# Patient Record
Sex: Female | Born: 1980 | Race: Black or African American | Hispanic: No | Marital: Single | Smoking: Never smoker
Health system: Southern US, Community
[De-identification: ages and names within clinical notes are randomized; demographics above are authoritative.]

---

## 2017-03-11 ENCOUNTER — Encounter: Payer: Self-pay | Admitting: *Deleted

## 2017-03-11 ENCOUNTER — Ambulatory Visit: Admission: EM | Admit: 2017-03-11 | Discharge: 2017-03-11 | Discharge status: Against Medical Advice | Payer: 59

## 2017-03-11 ENCOUNTER — Other Ambulatory Visit: Payer: Self-pay

## 2017-03-11 ENCOUNTER — Ambulatory Visit
Admission: EM | Admit: 2017-03-11 | Discharge: 2017-03-11 | Disposition: A | Discharge status: Home / Self Care | Payer: 59 | Attending: Emergency Medicine | Admitting: Emergency Medicine

## 2017-03-11 DIAGNOSIS — W57XXXA Bitten or stung by nonvenomous insect and other nonvenomous arthropods, initial encounter: Secondary | ICD-10-CM | POA: Diagnosis not present

## 2017-03-11 DIAGNOSIS — R21 Rash and other nonspecific skin eruption: Secondary | ICD-10-CM | POA: Diagnosis not present

## 2017-03-11 MED ORDER — PERMETHRIN 5 % EX CREA: TOPICAL_CREAM | CUTANEOUS | 0 refills | Status: DC

## 2017-03-11 MED ORDER — MUPIROCIN 2 % EX OINT: 1.0000 "application " | TOPICAL_OINTMENT | Freq: Three times a day (TID) | CUTANEOUS | 0 refills | Status: DC

## 2017-03-11 MED ORDER — PREDNISONE 10 MG (21) PO TBPK: ORAL_TABLET | ORAL | 0 refills | Status: DC

## 2017-03-11 NOTE — ED Provider Notes (Signed)
HPI  SUBJECTIVE:  Crystal Santana is a 36 y.o. female who presents with an itchy rash starting on her legs, now spreading to her arms and chest starting 1 week ago.  States it is itchy all day long.  No sensation of being bitten at night.  No blood on the bed clothes in the morning.  States that she has checked for bedbugs and has not found any.  No pets in the home.  No contacts with similar rash.  No new lotions, soaps, detergents, foods.  She does not take any medicines on a regular basis.  No fevers, crusting, proceeding paresthesias.  She tried washing her linens and a vinegar spray without improvement of symptoms.  No aggravating factors.  LMP: 1/1.  Denies possibility of being pregnant.  PMD: None   History reviewed. No pertinent past medical history.  History reviewed. No pertinent surgical history.  History reviewed. No pertinent family history.  Social History   Tobacco Use  . Smoking status: Never Smoker  . Smokeless tobacco: Never Used  Substance Use Topics  . Alcohol use: Yes    Frequency: Never  . Drug use: No    No current facility-administered medications for this encounter.   Current Outpatient Medications:  .  mupirocin ointment (BACTROBAN) 2 %, Apply 1 application topically 3 (three) times daily., Disp: 22 g, Rfl: 0 .  permethrin (ELIMITE) 5 % cream, Apply from chin down, leave on for 8-14 hours, rinse. Repeat in 1 week, Disp: 60 g, Rfl: 0 .  predniSONE (STERAPRED UNI-PAK 21 TAB) 10 MG (21) TBPK tablet, Dispense one 6 day pack. Take as directed with food., Disp: 21 tablet, Rfl: 0  No Known Allergies   ROS  As noted in HPI.   Physical Exam  BP (!) 143/90 (BP Location: Left Arm)   Pulse 96   Temp 98.6 F (37 C) (Oral)   Resp 16   Ht 5\' 7"  (1.702 m)   Wt 210 lb (95.3 kg)   LMP 02/25/2017 (Approximate)   SpO2 100%   BMI 32.89 kg/m   Constitutional: Well developed, well nourished, no acute distress Eyes:  EOMI, conjunctiva normal bilaterally HENT:  Normocephalic, atraumatic,mucus membranes moist Respiratory: Normal inspiratory effort Cardiovascular: Normal rate GI: nondistended Skin: Scattered erythematous raised maculopapular and pustular rash bilateral lower extremities, arms.  See pictures.  No burrows between fingers, crusting.         Musculoskeletal: no deformities Neurologic: Alert & oriented x 3, no focal neuro deficits Psychiatric: Speech and behavior appropriate   ED Course   Medications - No data to display  No orders of the defined types were placed in this encounter.   No results found for this or any previous visit (from the past 24 hour(s)). No results found.  ED Clinical Impression  Insect bite, initial encounter   ED Assessment/Plan   Presentation consistent with insect bites, favor bedbugs, although scabies is in the differential.  Will treat with permethrin, prednisone 6-day taper, Claritin or Zyrtec, Bactroban in case it starts to get infected, advised patient to wash all linens thoroughly.  Will provide primary care referral list for routine care.  May return here in 2 weeks if the permethrin does not help.  Advised her that this may not stop the itching, but the rash did not be spreading after finishing the permethrin. Discussed medical decision making, plan follow-up with patient.  She agrees with plan.  Meds ordered this encounter  Medications  . permethrin (ELIMITE) 5 %  cream    Sig: Apply from chin down, leave on for 8-14 hours, rinse. Repeat in 1 week    Dispense:  60 g    Refill:  0  . predniSONE (STERAPRED UNI-PAK 21 TAB) 10 MG (21) TBPK tablet    Sig: Dispense one 6 day pack. Take as directed with food.    Dispense:  21 tablet    Refill:  0  . mupirocin ointment (BACTROBAN) 2 %    Sig: Apply 1 application topically 3 (three) times daily.    Dispense:  22 g    Refill:  0    *This clinic note was created using Scientist, clinical (histocompatibility and immunogenetics)Dragon dictation software. Therefore, there may be occasional  mistakes despite careful proofreading.   ?   Domenick GongMortenson, Tahsin Benyo, MD 03/11/17 [id number]

## 2017-03-11 NOTE — ED Triage Notes (Signed)
Patient started having symptom of possible insect bites one week ago.  "Bites" are located on arms and legs.

## 2017-03-11 NOTE — Discharge Instructions (Signed)
I am giving you information about scabies so that you know what to look out for. Scabies is usually transmitted through close physical contact, but may be transmitted through clothing, linens, or towels. Apply the permethrin from head to soles of feet at bedtime, leave on for 8-14 hours. Mites tend to persist under the fingernails. Trim them, scrub beneath them, and apply the permetrhin under your nails. Repeat 1 week later. You will also need to treat family members, frequent household guests, close physical or sexual contacts, even if they have no symptoms. Wash all clothing, bedding, and towels in hot water, dry cleaned, or placed through the heat cycle of a dryer to prevent reinfection. Or you can place all bedding and clothing that might be infected in sealed plastic bags for 72 hours.  Make sure you thoroughly clean the infected person's room. The itching will not go away immediately. Continued itching does not mean that the treatment was ineffective. Dead mites and eggs will cause an immune response but will eventually go away as the skin turns over.   Here is a list of primary care providers who are taking new patients:  Dr. Elizabeth Sauereanna Jones, Dr. Schuyler AmorWilliam Plonk [address] [place] [address] [phone]  [facility] [place] [address]  [place] [address]  [phone]  [facility] [address]  [place], [place] [address] [phone]  [facility] [place] [address]  [phone] [place], [place] [address]  Here are clinics/ other resources who will see you if you do not have insurance. Some have certain criteria that you must meet. Call them and find out what they are:  [facility]: [address] [place], [place] [address] Phone: [phone] Hours: First and Third Saturdays of each Month, 9 a.m. - 1 p.m.  [facility]: [address], [place], [place] [address] Phone: [phone] Hours: Tuesday, 4 p.m. - 8  p.m. Thursday, 1 p.m. - 8 p.m. Wednesday, 9 a.m. - [facility]  [facility] [address], [place], [place] [address] Phone: [phone] Pharmacy Phone Number: [phone] Dental Phone Number: [phone] [facility] Insurance Help: [phone]  Dental Hours: Monday - Thursday, 8 a.m. - 6 p.m.  [facility] [address] [place], [place] [address] Phone: [phone] Pharmacy Phone Number: [phone] [facility] Insurance Help: [phone]  [facility] [address] [place], [place] [address] Phone: [phone] Pharmacy Phone Number: [phone] [facility] Insurance Help: [phone]  [facility] [address] [place], [place] [address] Phone: [phone] [facility] Insurance Help: [phone]   [facility] [address] [place], [place] [address] Phone: [phone]  Go to www.goodrx.com to look up your medications. This will give you a list of where you can find your prescriptions at the most affordable prices. Or ask the pharmacist what the cash price is, or if they have any other discount programs available to help make your medication more affordable. This can be less expensive than what you would pay with insurance.

## 2018-04-14 ENCOUNTER — Ambulatory Visit: Payer: BLUE CROSS/BLUE SHIELD | Admitting: Family Medicine

## 2018-04-30 ENCOUNTER — Encounter: Payer: Self-pay | Admitting: Emergency Medicine

## 2018-04-30 ENCOUNTER — Ambulatory Visit
Admission: EM | Admit: 2018-04-30 | Discharge: 2018-04-30 | Disposition: A | Discharge status: Home / Self Care | Payer: BLUE CROSS/BLUE SHIELD | Attending: Emergency Medicine | Admitting: Emergency Medicine

## 2018-04-30 ENCOUNTER — Other Ambulatory Visit: Payer: Self-pay

## 2018-04-30 ENCOUNTER — Ambulatory Visit (INDEPENDENT_AMBULATORY_CARE_PROVIDER_SITE_OTHER): Payer: BLUE CROSS/BLUE SHIELD

## 2018-04-30 DIAGNOSIS — R252 Cramp and spasm: Secondary | ICD-10-CM | POA: Insufficient documentation

## 2018-04-30 DIAGNOSIS — S93401S Sprain of unspecified ligament of right ankle, sequela: Secondary | ICD-10-CM | POA: Diagnosis not present

## 2018-04-30 MED ORDER — COQ-10 150 MG PO CAPS: 150.0000 mg | ORAL_CAPSULE | ORAL | 0 refills | Status: DC

## 2018-04-30 MED ORDER — IBUPROFEN 600 MG PO TABS: 600.0000 mg | ORAL_TABLET | Freq: Four times a day (QID) | ORAL | 0 refills | Status: DC | PRN

## 2018-04-30 NOTE — Discharge Instructions (Signed)
[facility]up with [facility] or Dr. Rosita KeaMenz as soon as you can for evaluation of your ankle.  You may need physical therapy to make this stable again.  I suspect that your calf cramping is coming from his chronic ankle stability, However, you can try magnesium, mustard, pickle juice and coenzyme Q to see if that does not help your calf cramps.   Here is a list of primary care providers who are taking new patients:  Dr. Elizabeth Sauereanna Jones, Dr. Schuyler AmorWilliam Plonk [address] [place] [address] [phone]  [facility] [place] [address]  [place] [address]  [phone]  [facility] [address]  [place], [place] [address] [phone]  [facility] [place] [address]  [phone] [place], [place] [address]  Here are clinics/ other resources who will see you if you do not have insurance. Some have certain criteria that you must meet. Call them and find out what they are:  [facility]: [address] [place] [place], [place] [address] Phone: [phone] Hours: First and Third Saturdays of each Month, 9 a.m. - 1 p.m.  [facility]: [address], [place], [place] [address] Phone: [phone] Hours: Tuesday, 4 p.m. - 8 p.m. Thursday, 1 p.m. - 8 p.m. Wednesday, 9 a.m. - [facility]  [facility] [address], [place], [place] [address] Phone: [phone] Pharmacy Phone Number: [phone] Dental Phone Number: [phone] [facility] Insurance Help: [phone]  Dental Hours: Monday - Thursday, 8 a.m. - 6 p.m.  [facility] [address] [place], [place] [address] Phone: [phone] Pharmacy Phone Number: [phone] [facility] Insurance Help: [phone]  [facility] [address] [place], [place] [address] Phone: [phone] Pharmacy Phone Number: [phone] [facility] Insurance Help: [phone]  [facility] [address] [place], [place]  [address] Phone: [phone] [facility] Insurance Help: [phone]   [facility] [address] [place], [place] [address] Phone: [phone]  Go to www.goodrx.com to look up your medications. This will give you a list of where you can find your prescriptions at the most affordable prices. Or ask the pharmacist what the cash price is, or if they have any other discount programs available to help make your medication more affordable. This can be less expensive than what you would pay with insurance.

## 2018-04-30 NOTE — ED Triage Notes (Signed)
Patient states she rolled her right ankle in July of 2018. Patient states she was never treated for this and now has started to have increasing ankle pain and feels cramping in her lower right leg. She is requesting an xray.

## 2018-04-30 NOTE — ED Provider Notes (Signed)
HPI  SUBJECTIVE:  Crystal Santana is a 38 y.o. female who presents with 2 years of lateral right ankle pain after spraining it in 2018.  She comes in today because she reports a "charley horse" in her lateral calf that radiates up the back of her leg that has been going on since she sprained her ankle..  Sometimes this occurs during the day, but primarily at night.  She states that it has gotten worse over the past 2 weeks.  She is concerned about a DVT.  She states that her ankle feels unstable.  No recent trauma to her ankle.  No ankle swelling, bruising, erythema.  No calf swelling, edema, exogenous estrogen use, surgery in the past 4 weeks, recent immobilization.  No calf cramping or pain in the left lower extremity.  She has tried Tylenol, and eating bananas with improvement in her right calf pain.  She also states that walking and standing makes it feel better.  Symptoms are worse with sitting for prolonged periods of time and at night.  Past medical history negative for DVT, PE.  LMP: Now.  Denies the possibility of being pregnant.  PMD: None.    History reviewed. No pertinent past medical history.  History reviewed. No pertinent surgical history.  History reviewed. No pertinent family history.  Social History   Tobacco Use  . Smoking status: Never Smoker  . Smokeless tobacco: Never Used  Substance Use Topics  . Alcohol use: Yes    Frequency: Never  . Drug use: No    No current facility-administered medications for this encounter.   Current Outpatient Medications:  .  isotretinoin (ACCUTANE) 10 MG capsule, Take 10 mg by mouth 2 (two) times daily., Disp: , Rfl:  .  Coenzyme Q10 (COQ-10) 150 MG CAPS, Take 150 mg by mouth 1 day or 1 dose., Disp: 30 each, Rfl: 0 .  ibuprofen (ADVIL,MOTRIN) 600 MG tablet, Take 1 tablet (600 mg total) by mouth every 6 (six) hours as needed., Disp: 30 tablet, Rfl: 0  No Known Allergies   ROS  As noted in HPI.   Physical Exam  BP (!) 147/91  (BP Location: Right Arm)   Pulse 98   Temp 98.8 F (37.1 C) (Oral)   Resp 18   Ht 5\' 6"  (1.676 m)   Wt 92.1 kg   LMP 04/24/2018   SpO2 99%   BMI 32.77 kg/m   Constitutional: Well developed, well nourished, no acute distress Eyes:  EOMI, conjunctiva normal bilaterally HENT: Normocephalic, atraumatic,mucus membranes moist Respiratory: Normal inspiratory effort Cardiovascular: Normal rate GI: nondistended skin: No rash, skin intact Musculoskeletal: R  Ankle Proximal fibula NT, Distal fibula NT, Medial malleolus NT ,  Deltoid ligament medially NT,   ATFL laterally tender, posterior tablofibular ligament laterally tender , calcaneofibular ligament laterally NT,  Achilles NT, calcaneus NT,  Proximal 5th metatarsal NT, Midfoot NT, distal NVI with baseline sensation / motor to foot with CR<2 seconds. no pain with dorsiflexion/plantar flexion. no pain with inversion/eversion.  No soft tissue swelling.- bruising. - squeeze test.  Ant drawer test stable. Pt able to bear weight in dept.  Positive lateral calf tenderness, no appreciable muscle spasm.  No popliteal tenderness.  No cord.  No tenderness over the medial inner thigh.  Calves symmetric.  No edema. Neurologic: Alert & oriented x 3, no focal neuro deficits Psychiatric: Speech and behavior appropriate   ED Course   Medications - No data to display  Orders Placed This Encounter  Procedures  .  DG Ankle Complete Right    Standing Status:   Standing    Number of Occurrences:   1    Order Specific Question:   Reason for Exam (SYMPTOM  OR DIAGNOSIS REQUIRED)    Answer:   twisted right ankle July 2018, has continued to have pain since then    No results found for this or any previous visit (from the past 24 hour(s)). Dg Ankle Complete Right  Result Date: 04/30/2018 CLINICAL DATA:  Injured 2 right ankle in 2018 with continued tenderness at the lateral malleolus. Recent cramps in calf and back of knee for 2 weeks. EXAM: RIGHT ANKLE -  COMPLETE 3+ VIEW COMPARISON:  None. FINDINGS: There is no evidence of fracture, dislocation, or joint effusion. There is no evidence of arthropathy or other focal bone abnormality. Soft tissues are unremarkable. IMPRESSION: Negative. Electronically Signed   By: Gerome Sam III M.D   On: 04/30/2018 19:45    ED Clinical Impression  Sprain of right ankle, unspecified ligament, sequela  Calf cramp   ED Assessment/Plan   Reviewed imaging independently.  Normal.  See radiology report for full details.  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  Doubt DVT today.  Calf cramps may be from altered mechanics while walking.  Sending home with ibuprofen/Tylenol combination, coenzyme Q, magnesium for the calf cramps which seem to happen particularly at night.  We will have her follow-up with Dr. Rosita Kea, orthopedics on call or with [facility] for evaluation of the ankle.  providing primary care referral list for routine care.  Discussed MDM, treatment plan, and plan for follow-up with patient. Discussed sn/sx that should prompt return to the ED. patient agrees with plan.   Meds ordered this encounter  Medications  . Coenzyme Q10 (COQ-10) 150 MG CAPS    Sig: Take 150 mg by mouth 1 day or 1 dose.    Dispense:  30 each    Refill:  0  . ibuprofen (ADVIL,MOTRIN) 600 MG tablet    Sig: Take 1 tablet (600 mg total) by mouth every 6 (six) hours as needed.    Dispense:  30 tablet    Refill:  0    *This clinic note was created using Scientist, clinical (histocompatibility and immunogenetics). Therefore, there may be occasional mistakes despite careful proofreading.   ?   Domenick Gong, MD 05/01/18 [id number]

## 2019-08-12 IMAGING — CR DG ANKLE COMPLETE 3+V*R*
3 series · 3 of 3 positions shown · non-contrast
Comparison: None.

CLINICAL DATA: Injured 2 right ankle in 0295 with continued
tenderness at the lateral malleolus. Recent cramps in calf and back
of knee for 2 weeks.

EXAM:
RIGHT ANKLE - COMPLETE 3+ VIEW

[ankle ap]
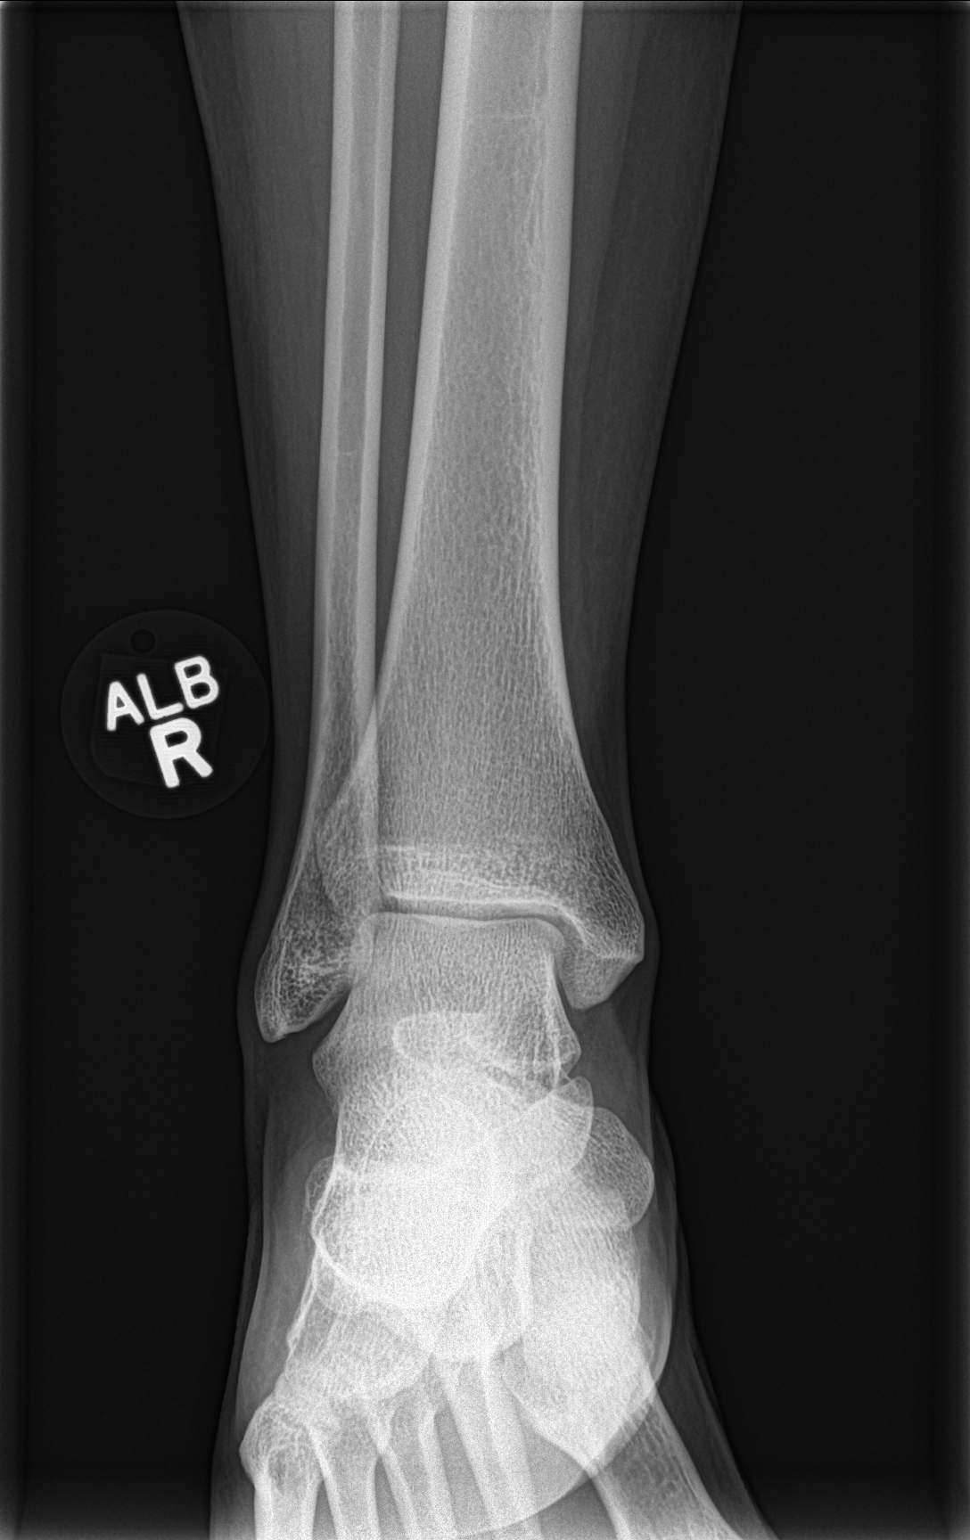

[ankle obl]
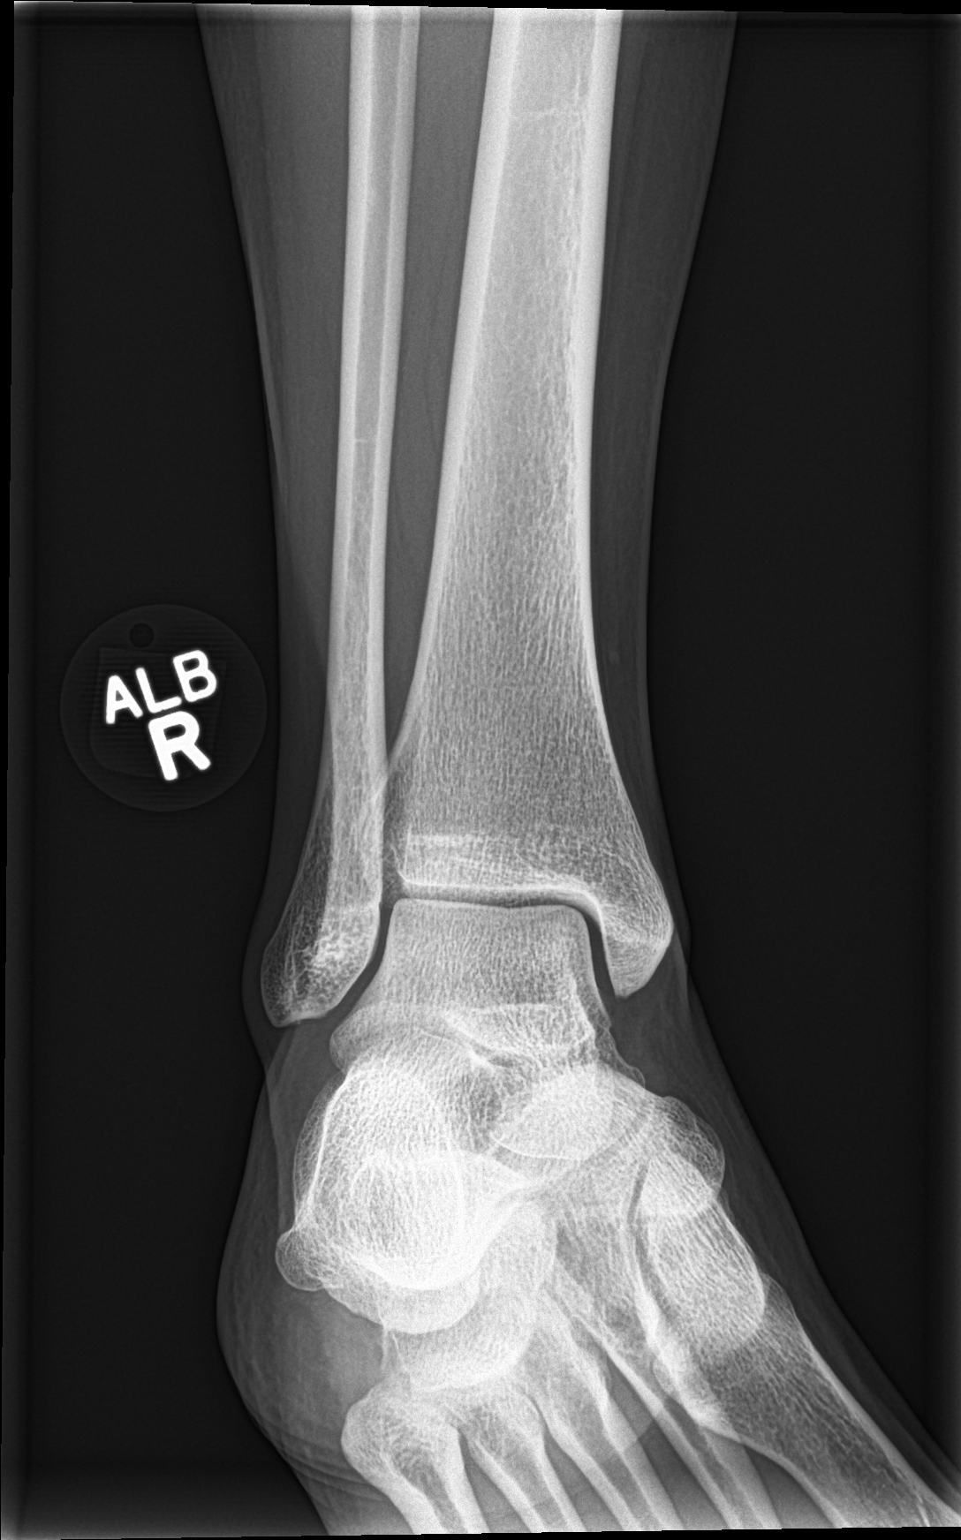

[ankle lat]
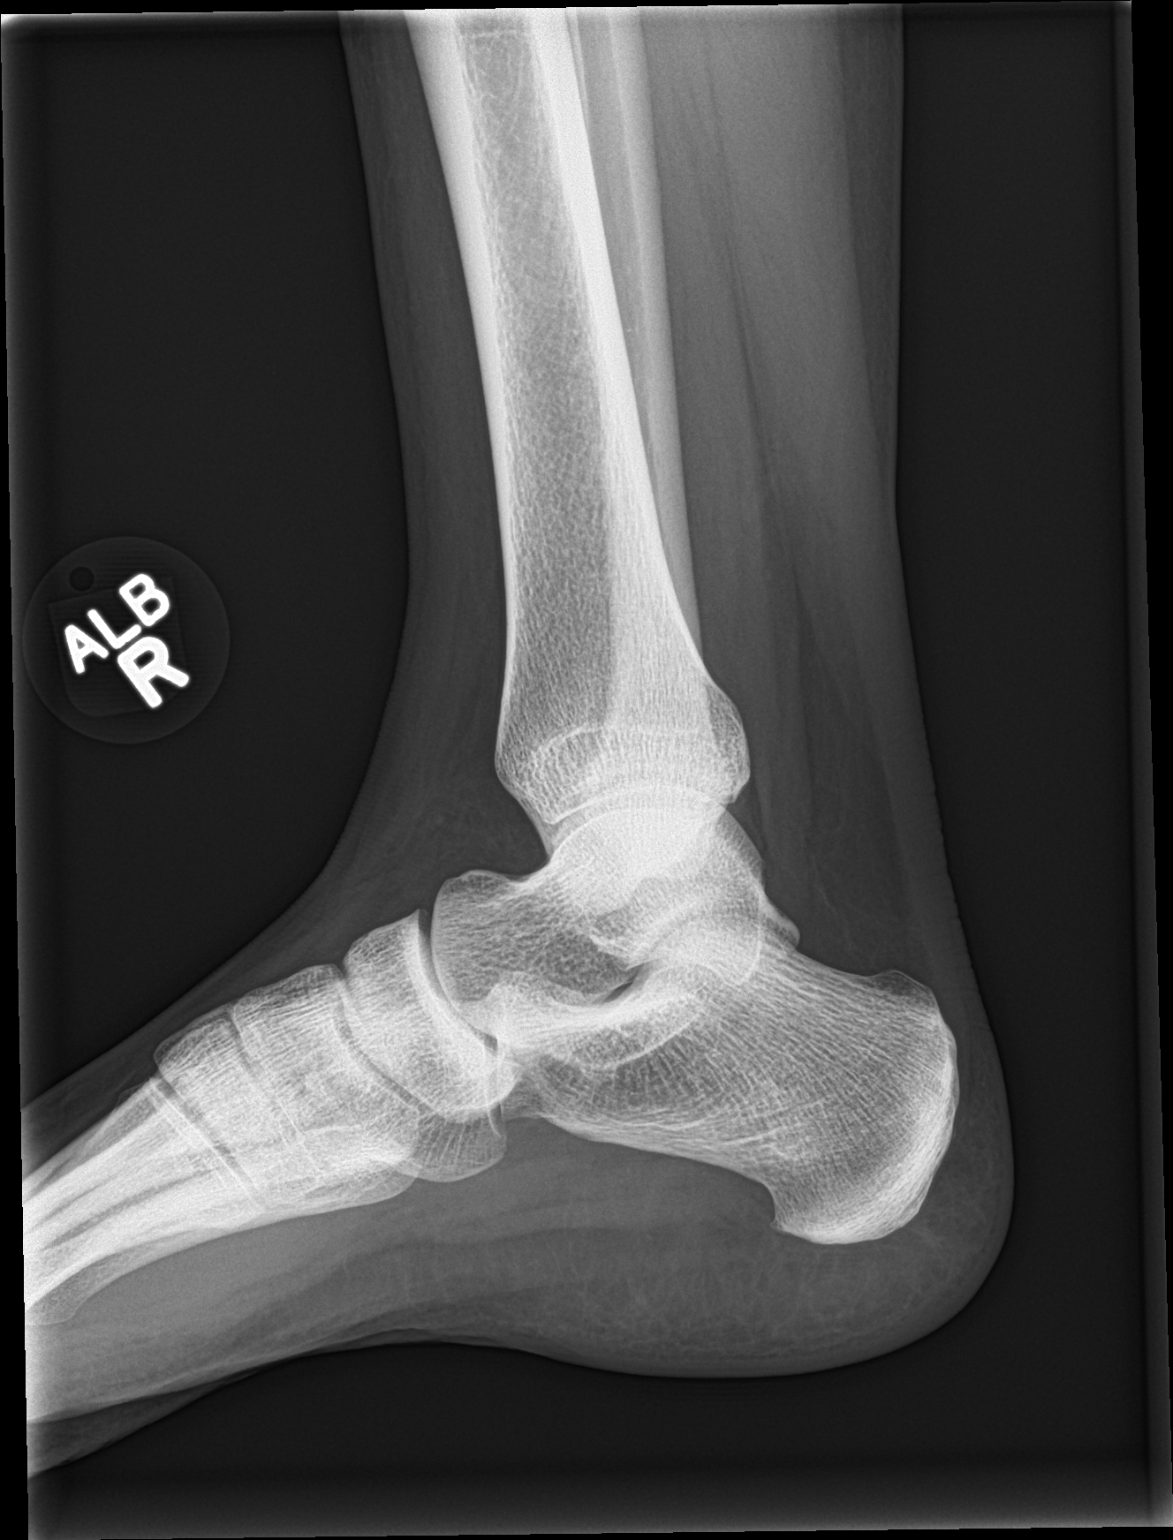

[3 of 3 positions shown; findings below may reference images not displayed]

FINDINGS: There is no evidence of fracture, dislocation, or joint effusion.
There is no evidence of arthropathy or other focal bone abnormality.
Soft tissues are unremarkable.
IMPRESSION: Negative.

## 2020-06-04 ENCOUNTER — Emergency Department
Admission: EM | Admit: 2020-06-04 | Discharge: 2020-06-04 | Disposition: A | Discharge status: Home / Self Care | Payer: 59 | Attending: Emergency Medicine | Admitting: Emergency Medicine

## 2020-06-04 ENCOUNTER — Other Ambulatory Visit: Payer: Self-pay

## 2020-06-04 DIAGNOSIS — W57XXXA Bitten or stung by nonvenomous insect and other nonvenomous arthropods, initial encounter: Secondary | ICD-10-CM | POA: Insufficient documentation

## 2020-06-04 DIAGNOSIS — S30861A Insect bite (nonvenomous) of abdominal wall, initial encounter: Secondary | ICD-10-CM | POA: Diagnosis present

## 2020-06-04 DIAGNOSIS — Z5321 Procedure and treatment not carried out due to patient leaving prior to being seen by health care provider: Secondary | ICD-10-CM | POA: Diagnosis not present

## 2020-06-04 NOTE — ED Triage Notes (Signed)
Reports she has tick to left side of abdomen; discovered today. Pt alert and oriented X4, cooperative, RR even and unlabored, color WNL. Pt in NAD.

## 2021-06-26 ENCOUNTER — Ambulatory Visit (INDEPENDENT_AMBULATORY_CARE_PROVIDER_SITE_OTHER): Payer: BC Managed Care – PPO | Admitting: Nurse Practitioner

## 2021-06-26 ENCOUNTER — Other Ambulatory Visit: Payer: Self-pay

## 2021-06-26 ENCOUNTER — Encounter: Payer: Self-pay | Admitting: Nurse Practitioner

## 2021-06-26 VITALS — BP 122/88 | HR 89 | Temp 98.1°F | Ht 66.0 in | Wt 231.2 lb

## 2021-06-26 DIAGNOSIS — Z6837 Body mass index (BMI) 37.0-37.9, adult: Secondary | ICD-10-CM | POA: Diagnosis not present

## 2021-06-26 DIAGNOSIS — Z7689 Persons encountering health services in other specified circumstances: Secondary | ICD-10-CM | POA: Diagnosis not present

## 2021-06-26 DIAGNOSIS — E6609 Other obesity due to excess calories: Secondary | ICD-10-CM

## 2021-06-26 DIAGNOSIS — R635 Abnormal weight gain: Secondary | ICD-10-CM | POA: Diagnosis not present

## 2021-06-26 NOTE — Progress Notes (Signed)
?I,Victoria T Hamilton,acting as a scribe for Minette Brine, FNP.,have documented all relevant documentation on the behalf of Minette Brine, FNP,as directed by  Minette Brine, FNP while in the presence of Minette Brine, [place].  ? ?This visit occurred during the SARS-CoV-2 public health emergency.  Safety protocols were in place, including screening questions prior to the visit, additional usage of staff PPE, and extensive cleaning of exam room while observing appropriate contact time as indicated for disinfecting solutions. ? ?Subjective:  ?  ? Patient ID: Crystal Santana , female    DOB: [birth year] , 41 y.o.   MRN: [id number] ? ? ?Chief Complaint  ?Patient presents with  ? Establish Care  ? ? ?HPI ? ?Pt presents today to establish care. [facility] [place], Kathyrn Drown, [place]. She has been in the [place] area for the last 4 years. 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  Divorced. Has one son - 35 y/o - healthy.  She was seen in at her PCP in [place], [place] last year for labs. She also goes to a GYN in [place]. ? ?PMH - none ? ?[facility] - materanal grandfather - testicular cancer, paternal grandmother Alzheimers ? ?Her specific concerns are weight loss & slow hair growth. She has been battling her weight issues without results. She has taken [place]  ?She was 248 lbs in April and now 231 lbs, August 2022 was 249 lbs she also has a part time job to help her to get out of the house. She is having knee pain.  Exercising with elliptical for 1/2 hour, 2-3 times a week. She also has a dog to be more active.  No strength training. She feels like she does not eat much and will try to eat the best options. Sausage dog, nutella with slice of bread and water. She drinks about 3-4 bottles a day. Tries to avoid soda. Does not drink juice.  ? ?She reports discussing xenical with previous PCP. Taken only for 3 days, she had an accident &  after that she discontinued taking medication. She has also tried phentermine in the past, had jitteryness, felt like it caused her hair to come out. She was on the 37.5 mg dose. She does not feel she was losing any weight.  ? ?Short term goal: she had been 160 lbs most of the time then when started working at home her weight increased to 205 lbs and covid made worse.   ? ?Wt Readings from Last 3 Encounters: ?06/26/21 : 231 lb 3.2 oz (104.9 kg) ?06/04/20 : 220 lb (99.8 kg) ?04/30/18 : 203 lb (92.1 kg) ?  ?  ? ?History reviewed. No pertinent past medical history.  ? ?Family History  ?Problem Relation Age of Onset  ? Scleroderma Mother   ? ? ? ?Current Outpatient Medications:  ?  Coenzyme Q10 (COQ-10) 150 MG CAPS, Take 150 mg by mouth 1 day or 1 dose. (Patient not taking: Reported on 06/26/2021), Disp: 30 each, Rfl: 0 ?  ibuprofen (ADVIL,MOTRIN) 600 MG tablet, Take 1 tablet (600 mg total) by mouth every 6 (six) hours as needed. (Patient not taking: Reported on 06/26/2021), Disp: 30 tablet, Rfl: 0 ?  isotretinoin (ACCUTANE) 10 MG capsule, Take 10 mg by mouth 2 (two) times daily. (Patient not taking: Reported on 06/26/2021), Disp: , Rfl:  ?  Naltrexone-buPROPion HCl ER (CONTRAVE) 8-90 MG TB12, Start 1 tablet every  morning for 7 days, then 1 tablet twice daily for 7 days, then 2 tablets every morning and one every evening, Disp: 120 tablet, Rfl: 1  ? ?No Known Allergies  ? ?Review of Systems  ?Constitutional: Negative.   ?Respiratory: Negative.    ?Cardiovascular: Negative.   ?Neurological: Negative.   ?Psychiatric/Behavioral: Negative.     ? ?Today's Vitals  ? 06/26/21 1122  ?BP: 122/88  ?Pulse: 89  ?Temp: 98.1 ?F (36.7 ?C)  ?SpO2: 98%  ?Weight: 231 lb 3.2 oz (104.9 kg)  ?Height: '5\' 6"'  (1.676 m)  ? ?Body mass index is 37.32 kg/m?.  ?Wt Readings from Last 3 Encounters:  ?06/26/21 231 lb 3.2 oz (104.9 kg)  ?06/04/20 220 lb (99.8 kg)  ?04/30/18 203 lb (92.1 kg)  ?  ?Objective:  ?Physical Exam ?Vitals reviewed.   ?Constitutional:   ?   General: She is not in acute distress. ?   Appearance: Normal appearance. She is well-developed. She is obese.  ?Cardiovascular:  ?   Rate and Rhythm: Normal rate and regular rhythm.  ?   Pulses: Normal pulses.  ?   Heart sounds: Normal heart sounds. No murmur heard. ?Pulmonary:  ?   Effort: Pulmonary effort is normal. No respiratory distress.  ?   Breath sounds: Normal breath sounds. No wheezing.  ?Chest:  ?   Chest wall: No tenderness.  ?Musculoskeletal:     ?   General: Normal range of motion.  ?Skin: ?   General: Skin is warm and dry.  ?   Capillary Refill: Capillary refill takes less than 2 seconds.  ?Neurological:  ?   General: No focal deficit present.  ?   Mental Status: She is alert and oriented to person, place, and time.  ?   Cranial Nerves: No cranial nerve deficit.  ?Psychiatric:     ?   Mood and Affect: Mood normal.     ?   Behavior: Behavior normal.     ?   Thought Content: Thought content normal.     ?   Judgment: Judgment normal.  ?  ? ?   ?Assessment And Plan:  ?   ?1. Abnormal weight gain ?Comments: Will check for metabolic causes.  ?- TSH ?- Insulin, random(561) ? ?2. 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 ?3. Encounter to establish care ?Attempted to find old records on Care Everywhere ? ? ?Patient was given opportunity to ask questions. Patient verbalized understanding of the plan and was able to repeat key elements of the plan. All questions were answered to their satisfaction.  ?Minette Brine, FNP  ? ?I, Minette Brine, FNP, have reviewed all documentation for this visit. The documentation on 06/26/21 for the exam, diagnosis, procedures, and orders are all accurate and complete.  ? ?IF YOU HAVE  BEEN REFERRED TO A SPECIALIST, IT MAY TAKE 1-2 WEEKS TO SCHEDULE/PROCESS THE REFERRAL. IF YOU HAVE NOT HEARD FROM US/SPECIALIST IN TWO WEEKS, PLEASE GIVE [place] A CALL AT [phone] X [phone].  ? ?THE PATIENT IS ENCOURAGED TO PRACTICE SOCIAL DISTANCING DUE TO THE COVID-19 PANDEMIC.   ?

## 2021-06-26 NOTE — Patient Instructions (Addendum)
Exercising to Lose Weight ?Getting regular exercise is important for everyone. It is especially important if you are overweight. Being overweight increases your risk of heart disease, stroke, diabetes, high blood pressure, and several types of cancer. Exercising, and reducing the calories you consume, can help you lose weight and improve fitness and health. ?Exercise can be moderate or vigorous intensity. To lose weight, most people need to do a certain amount of moderate or vigorous-intensity exercise each week. ?How can exercise affect me? ?You lose weight when you exercise enough to burn more calories than you eat. Exercise also reduces body fat and builds muscle. The more muscle you have, the more calories you burn. Exercise also: ?Improves mood. ?Reduces stress and tension. ?Improves your overall fitness, flexibility, and endurance. ?Increases bone strength. ?Moderate-intensity exercise ?Moderate-intensity exercise is any activity that gets you moving enough to burn at least three times more energy (calories) than if you were sitting. ?Examples of moderate exercise include: ?Walking a mile in 15 minutes. ?Doing light yard work. ?Biking at an easy pace. ?Most people should get at least 150 minutes of moderate-intensity exercise a week to maintain their body weight. ?Vigorous-intensity exercise ?Vigorous-intensity exercise is any activity that gets you moving enough to burn at least six times more calories than if you were sitting. When you exercise at this intensity, you should be working hard enough that you are not able to carry on a conversation. ?Examples of vigorous exercise include: ?Running. ?Playing a team sport, such as football, basketball, and soccer. ?Jumping rope. ?Most people should get at least 75 minutes a week of vigorous exercise to maintain their body weight. ?What actions can I take to lose weight? ?The amount of exercise you need to lose weight depends on: ?Your age. ?The type of  exercise. ?Any health conditions you have. ?Your overall physical ability. ?Talk to your health care provider about how much exercise you need and what types of activities are safe for you. ?Nutrition ? ?Make changes to your diet as told by your health care provider or diet and nutrition specialist (dietitian). This may include: ?Eating fewer calories. ?Eating more protein. ?Eating less unhealthy fats. ?Eating a diet that includes fresh fruits and vegetables, whole grains, low-fat dairy products, and lean protein. ?Avoiding foods with added fat, salt, and sugar. ?Drink plenty of water while you exercise to prevent dehydration or heat stroke. ?Activity ?Choose an activity that you enjoy and set realistic goals. Your health care provider can help you make an exercise plan that works for you. ?Exercise at a moderate or vigorous intensity most days of the week. ?The intensity of exercise may vary from person to person. You can tell how intense a workout is for you by paying attention to your breathing and heartbeat. Most people will notice their breathing and heartbeat get faster with more intense exercise. ?Do resistance training twice each week, such as: ?Push-ups. ?Sit-ups. ?Lifting weights. ?Using resistance bands. ?Getting short amounts of exercise can be just as helpful as long, structured periods of exercise. If you have trouble finding time to exercise, try doing these things as part of your daily routine: ?Get up, stretch, and walk around every 30 minutes throughout the day. ?Go for a walk during your lunch break. ?Park your car farther away from your destination. ?If you take public transportation, get off one stop early and walk the rest of the way. ?Make phone calls while standing up and walking around. ?Take the stairs instead of elevators or escalators. ?Wear  comfortable clothes and shoes with good support. ?Do not exercise so much that you hurt yourself, feel dizzy, or get very short of breath. ?Where to  find more information ?U.S. Department of Health and Human Services: BondedCompany.at ?Centers for Disease Control and Prevention: http://www.wolf.info/ ?Contact a health care provider: ?Before starting a new exercise program. ?If you have questions or concerns about your weight. ?If you have a medical problem that keeps you from exercising. ?Get help right away if: ?You have any of the following while exercising: ?Injury. ?Dizziness. ?Difficulty breathing or shortness of breath that does not go away when you stop exercising. ?Chest pain. ?Rapid heartbeat. ?These symptoms may represent a serious problem that is an emergency. Do not wait to see if the symptoms will go away. Get medical help right away. Call your local emergency services (911 in the [place] Do not drive yourself to the hospital. ?Summary ?Getting regular exercise is especially important if you are overweight. ?Being overweight increases your risk of heart disease, stroke, diabetes, high blood pressure, and several types of cancer. ?Losing weight happens when you burn more calories than you eat. ?Reducing the amount of calories you eat, and getting regular moderate or vigorous exercise each week, helps you lose weight. ?This information is not intended to replace advice given to you by your health care provider. Make sure you discuss any questions you have with your health care provider. ?Document Revised: 05/14/2020 Document Reviewed: 05/14/2020 ?Elsevier Patient Education ? [place]. ? ? ?Healthy Eating ?Following a healthy eating pattern may help you to achieve and maintain a healthy body weight, reduce the risk of chronic disease, and live a long and productive life. It is important to follow a healthy eating pattern at an appropriate calorie level for your body. Your nutritional needs should be met primarily through food by choosing a variety of nutrient-rich foods. ?What are tips for following this plan? ?Reading food labels ?Read labels and choose the  following: ?Reduced or low sodium. ?Juices with 100% fruit juice. ?Foods with low saturated fats and high polyunsaturated and monounsaturated fats. ?Foods with whole grains, such as whole wheat, cracked wheat, brown rice, and wild rice. ?Whole grains that are fortified with folic acid. This is recommended for women who are pregnant or who want to become pregnant. ?Read labels and avoid the following: ?Foods with a lot of added sugars. These include foods that contain brown sugar, corn sweetener, corn syrup, dextrose, fructose, glucose, high-fructose corn syrup, honey, invert sugar, lactose, malt syrup, maltose, molasses, raw sugar, sucrose, trehalose, or turbinado sugar. ?Do not eat more than the following amounts of added sugar per day: ?6 teaspoons (25 g) for women. ?9 teaspoons (38 g) for men. ?Foods that contain processed or refined starches and grains. ?Refined grain products, such as white flour, degermed cornmeal, white bread, and white rice. ?Shopping ?Choose nutrient-rich snacks, such as vegetables, whole fruits, and nuts. Avoid high-calorie and high-sugar snacks, such as potato chips, fruit snacks, and candy. ?Use oil-based dressings and spreads on foods instead of solid fats such as butter, stick margarine, or cream cheese. ?Limit pre-made sauces, mixes, and "instant" products such as flavored rice, instant noodles, and ready-made pasta. ?Try more plant-protein sources, such as tofu, tempeh, black beans, edamame, lentils, nuts, and seeds. ?Explore eating plans such as the Mediterranean diet or vegetarian diet. ?Cooking ?Use oil to saut? or stir-fry foods instead of solid fats such as butter, stick margarine, or lard. ?Try baking, boiling, grilling, or broiling instead of  frying. ?Remove the fatty part of meats before cooking. ?Steam vegetables in water or broth. ?Meal planning ? ?At meals, imagine dividing your plate into fourths: ?One-half of your plate is fruits and vegetables. ?One-fourth of your plate  is whole grains. ?One-fourth of your plate is protein, especially lean meats, poultry, eggs, tofu, beans, or nuts. ?Include low-fat dairy as part of your daily diet. ?Lifestyle ?Choose healthy options in all se

## 2021-06-27 LAB — CBC
Hematocrit: 38.1 % (ref 34.0–46.6)
Hemoglobin: 12.6 g/dL (ref 11.1–15.9)
MCH: 29.4 pg (ref 26.6–33.0)
MCHC: 33.1 g/dL (ref 31.5–35.7)
MCV: 89 fL (ref 79–97)
Platelets: 368 10*3/uL (ref 150–450)
RBC: 4.28 x10E6/uL (ref 3.77–5.28)
RDW: 12.6 % (ref 11.7–15.4)
WBC: 5.9 10*3/uL (ref 3.4–10.8)

## 2021-06-27 LAB — BMP8+EGFR
BUN/Creatinine Ratio: 12 (ref 9–23)
BUN: 8 mg/dL (ref 6–20)
CO2: 20 mmol/L (ref 20–29)
Calcium: 9.3 mg/dL (ref 8.7–10.2)
Chloride: 105 mmol/L (ref 96–106)
Creatinine, Ser: 0.69 mg/dL (ref 0.57–1.00)
Glucose: 81 mg/dL (ref 70–99)
Potassium: 4.6 mmol/L (ref 3.5–5.2)
Sodium: 139 mmol/L (ref 134–144)
eGFR: 113 mL/min/{1.73_m2} (ref >59)

## 2021-06-27 LAB — TSH: TSH: 1.38 u[IU]/mL (ref 0.450–4.500)

## 2021-06-27 LAB — INSULIN, RANDOM: INSULIN: 12.2 u[IU]/mL (ref 2.6–24.9)

## 2021-06-28 ENCOUNTER — Encounter: Payer: Self-pay | Admitting: Nurse Practitioner

## 2021-07-02 ENCOUNTER — Other Ambulatory Visit: Payer: Self-pay | Admitting: Nurse Practitioner

## 2021-07-03 ENCOUNTER — Other Ambulatory Visit: Payer: Self-pay | Admitting: Nurse Practitioner

## 2021-07-03 ENCOUNTER — Encounter: Payer: Self-pay | Admitting: Nurse Practitioner

## 2021-07-03 MED ORDER — CONTRAVE 8-90 MG PO TB12: ORAL_TABLET | ORAL | 1 refills | Status: DC

## 2021-09-27 ENCOUNTER — Other Ambulatory Visit: Payer: Self-pay | Admitting: Nurse Practitioner

## 2021-11-05 ENCOUNTER — Encounter: Payer: BC Managed Care – PPO | Admitting: Nurse Practitioner

## 2021-11-28 ENCOUNTER — Ambulatory Visit (INDEPENDENT_AMBULATORY_CARE_PROVIDER_SITE_OTHER): Payer: BC Managed Care – PPO | Admitting: Nurse Practitioner

## 2021-11-28 ENCOUNTER — Encounter: Payer: Self-pay | Admitting: Nurse Practitioner

## 2021-11-28 VITALS — BP 126/72 | Temp 98.2°F | Ht 66.0 in | Wt 218.2 lb

## 2021-11-28 DIAGNOSIS — Z1159 Encounter for screening for other viral diseases: Secondary | ICD-10-CM

## 2021-11-28 DIAGNOSIS — Z114 Encounter for screening for human immunodeficiency virus [HIV]: Secondary | ICD-10-CM

## 2021-11-28 DIAGNOSIS — Z0000 Encounter for general adult medical examination without abnormal findings: Secondary | ICD-10-CM

## 2021-11-28 DIAGNOSIS — Z6835 Body mass index (BMI) 35.0-35.9, adult: Secondary | ICD-10-CM | POA: Diagnosis not present

## 2021-11-28 DIAGNOSIS — E6609 Other obesity due to excess calories: Secondary | ICD-10-CM | POA: Diagnosis not present

## 2021-11-28 MED ORDER — CONTRAVE 8-90 MG PO TB12: ORAL_TABLET | ORAL | 0 refills | Status: DC

## 2021-11-28 NOTE — Progress Notes (Signed)
I,Crystal Santana,acting as a Education administrator for Crystal Santana, Crystal Santana.,have documented all relevant documentation on the behalf of Crystal Santana, Crystal Santana,as directed by  Crystal Santana, Crystal Santana while in the presence of Crystal Santana, [name].   Subjective:     Patient ID: Crystal Santana , female    DOB: 1981/02/07 , 41 y.o.   MRN: [id number]   Chief Complaint  Patient presents with   Annual Exam    HPI  Here for HM, she has her PAPs done by Dr Burnard Hawthorne in [place].   Wt Readings from Last 3 Encounters: 11/28/21 : 218 lb 3.2 oz (99 kg) 06/26/21 : 231 lb 3.2 oz (104.9 kg) 06/04/20 : 220 lb (99.8 kg)       History reviewed. No pertinent past medical history.   Family History  Problem Relation Age of Onset   Scleroderma Mother      Current Outpatient Medications:    Coenzyme Q10 (COQ-10) 150 MG CAPS, Take 150 mg by mouth 1 day or 1 dose. (Patient not taking: Reported on 06/26/2021), Disp: 30 each, Rfl: 0   ibuprofen (ADVIL,MOTRIN) 600 MG tablet, Take 1 tablet (600 mg total) by mouth every 6 (six) hours as needed. (Patient not taking: Reported on 06/26/2021), Disp: 30 tablet, Rfl: 0   isotretinoin (ACCUTANE) 10 MG capsule, Take 10 mg by mouth 2 (two) times daily. (Patient not taking: Reported on 06/26/2021), Disp: , Rfl:    Naltrexone-buPROPion HCl ER (CONTRAVE) 8-90 MG TB12, Take 2 tablets twice a day, Disp: 120 tablet, Rfl: 0   No Known Allergies    The patient states she uses none for birth control.  Patient's last menstrual period was 11/26/2021.. Negative for Dysmenorrhea and Negative for Menorrhagia. Negative for: breast discharge, breast lump(s), breast pain and breast self exam. Associated symptoms include abnormal vaginal bleeding. Pertinent negatives include abnormal bleeding (hematology), anxiety, decreased libido, depression, difficulty falling sleep, dyspareunia, history of infertility, nocturia, sexual dysfunction, sleep disturbances, urinary incontinence, urinary urgency, vaginal  discharge and vaginal itching. Diet regular; eating a good amount of protein. She is up to 2 pills a day of contrave. She recently went on vacation for one week and is taking one a day. The patient states her exercise level is moderate - 20 minutes 3-4 days a week.   The patient's tobacco use is:  Social History   Tobacco Use  Smoking Status Never  Smokeless Tobacco Never   She has been exposed to passive smoke. The patient's alcohol use is:  Social History   Substance and Sexual Activity  Alcohol Use Yes   Comment: socially   Additional information: Last pap 2022 per patient, next one scheduled for 2025.    Review of Systems  Constitutional: Negative.   HENT: Negative.    Eyes: Negative.   Respiratory: Negative.    Cardiovascular: Negative.   Gastrointestinal: Negative.   Endocrine: Negative.   Genitourinary: Negative.   Musculoskeletal: Negative.   Skin: Negative.   Allergic/Immunologic: Negative.   Neurological: Negative.   Hematological: Negative.   Psychiatric/Behavioral: Negative.       Today's Vitals   11/28/21 1420  BP: 126/72  Temp: 98.2 F (36.8 C)  SpO2: 98%  Weight: 218 lb 3.2 oz (99 kg)  Height: '5\' 6"'  (1.676 m)  PainSc: 0-No pain   Body mass index is 35.22 kg/m.  Wt Readings from Last 3 Encounters:  11/28/21 218 lb 3.2 oz (99 kg)  06/26/21 231 lb 3.2 oz (104.9 kg)  06/04/20 220 lb (99.8  kg)    Objective:  Physical Exam Vitals reviewed.  Constitutional:      General: She is not in acute distress.    Appearance: Normal appearance. She is well-developed. She is obese.  HENT:     Head: Normocephalic and atraumatic.     Right Ear: Hearing, tympanic membrane, ear canal and external ear normal. There is no impacted cerumen.     Left Ear: Hearing, tympanic membrane, ear canal and external ear normal. There is no impacted cerumen.     Nose:     Comments: Deferred - masked    Mouth/Throat:     Comments: Deferred - masked Eyes:     General: Lids  are normal.     Extraocular Movements: Extraocular movements intact.     Conjunctiva/sclera: Conjunctivae normal.     Pupils: Pupils are equal, round, and reactive to light.     Funduscopic exam:    Right eye: No papilledema.        Left eye: No papilledema.  Neck:     Thyroid: No thyroid mass.     Vascular: No carotid bruit.  Cardiovascular:     Rate and Rhythm: Normal rate and regular rhythm.     Pulses: Normal pulses.     Heart sounds: Normal heart sounds. No murmur heard. Pulmonary:     Effort: Pulmonary effort is normal. No respiratory distress.     Breath sounds: Normal breath sounds. No wheezing.  Chest:     Chest wall: No mass.  Breasts:    Tanner Score is 5.     Right: Normal. No mass or tenderness.     Left: Normal. No mass or tenderness.  Abdominal:     General: Abdomen is flat. Bowel sounds are normal. There is no distension.     Palpations: Abdomen is soft.     Tenderness: There is no abdominal tenderness.  Genitourinary:    Rectum: Guaiac result negative.  Musculoskeletal:        General: No swelling. Normal range of motion.     Cervical back: Full passive range of motion without pain, normal range of motion and neck supple.     Right lower leg: No edema.     Left lower leg: No edema.  Lymphadenopathy:     Upper Body:     Right upper body: No supraclavicular, axillary or pectoral adenopathy.     Left upper body: No supraclavicular, axillary or pectoral adenopathy.  Skin:    General: Skin is warm and dry.     Capillary Refill: Capillary refill takes less than 2 seconds.  Neurological:     General: No focal deficit present.     Mental Status: She is alert and oriented to person, place, and time.     Cranial Nerves: No cranial nerve deficit.     Sensory: No sensory deficit.  Psychiatric:        Mood and Affect: Mood normal.        Behavior: Behavior normal.        Thought Content: Thought content normal.        Judgment: Judgment normal.          Assessment And Plan:     1. Encounter for annual health examination Behavior modifications discussed and diet history reviewed.   Pt will continue to exercise regularly and modify diet with low GI, plant based foods and decrease intake of processed foods.  Recommend intake of daily multivitamin, Vitamin D, and calcium.  Recommend  mammogram for preventive screenings, as well as recommend immunizations that include influenza, TDAP - CBC - Lipid panel  2. 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 If not approved with insurance may be able to pay cash pay with mail order at $99 for one month supply Contrave teaching done  Return in 2 months for weight check. - CMP14+EGFR - Hemoglobin A1c - Naltrexone-buPROPion HCl ER (CONTRAVE) 8-90 MG TB12; Take 2 tablets twice a day  Dispense: 120 tablet; Refill: 0  3. Encounter for hepatitis C screening test for low risk patient Will check Hepatitis C screening due to recent recommendations to screen all adults 18 years and older - Hepatitis C antibody  4. Encounter for HIV (human immunodeficiency virus) test - HIV antibody (with reflex)   Patient was given opportunity to ask questions. Patient verbalized understanding of the plan and was able to repeat key elements of the plan. All questions were answered to their satisfaction.   Crystal Santana, Crystal Santana   I, Crystal Santana, Crystal Santana, have reviewed all documentation for this visit. The documentation on 11/28/21 for the exam, diagnosis, procedures, and orders are all accurate and complete.   THE PATIENT IS  ENCOURAGED TO PRACTICE SOCIAL DISTANCING DUE TO THE COVID-19 PANDEMIC.

## 2021-11-28 NOTE — Patient Instructions (Signed)

## 2021-11-29 LAB — CMP14+EGFR
ALT: 13 IU/L (ref 0–32)
AST: 13 IU/L (ref 0–40)
Albumin/Globulin Ratio: 1.3 (ref 1.2–2.2)
Albumin: 4.4 g/dL (ref 3.9–4.9)
Alkaline Phosphatase: 62 IU/L (ref 44–121)
BUN/Creatinine Ratio: 19 (ref 9–23)
BUN: 14 mg/dL (ref 6–24)
Bilirubin Total: 0.6 mg/dL (ref 0.0–1.2)
CO2: 21 mmol/L (ref 20–29)
Calcium: 9.4 mg/dL (ref 8.7–10.2)
Chloride: 101 mmol/L (ref 96–106)
Creatinine, Ser: 0.74 mg/dL (ref 0.57–1.00)
Globulin, Total: 3.4 g/dL (ref 1.5–4.5)
Glucose: 88 mg/dL (ref 70–99)
Potassium: 4.3 mmol/L (ref 3.5–5.2)
Sodium: 136 mmol/L (ref 134–144)
Total Protein: 7.8 g/dL (ref 6.0–8.5)
eGFR: 105 mL/min/{1.73_m2} (ref >59)

## 2021-11-29 LAB — CBC
Hematocrit: 38.8 % (ref 34.0–46.6)
Hemoglobin: 12.9 g/dL (ref 11.1–15.9)
MCH: 30 pg (ref 26.6–33.0)
MCHC: 33.2 g/dL (ref 31.5–35.7)
MCV: 90 fL (ref 79–97)
Platelets: 396 10*3/uL (ref 150–450)
RBC: 4.3 x10E6/uL (ref 3.77–5.28)
RDW: 13 % (ref 11.7–15.4)
WBC: 8.1 10*3/uL (ref 3.4–10.8)

## 2021-11-29 LAB — LIPID PANEL
Chol/HDL Ratio: 3.6 ratio (ref 0.0–4.4)
Cholesterol, Total: 232 mg/dL — ABNORMAL HIGH (ref 100–199)
HDL: 65 mg/dL (ref >39)
LDL Chol Calc (NIH): 151 mg/dL — ABNORMAL HIGH (ref 0–99)
Triglycerides: 91 mg/dL (ref 0–149)
VLDL Cholesterol Cal: 16 mg/dL (ref 5–40)

## 2021-11-29 LAB — HEMOGLOBIN A1C
Est. average glucose Bld gHb Est-mCnc: 111 mg/dL
Hgb A1c MFr Bld: 5.5 % (ref 4.8–5.6)

## 2021-11-29 LAB — HEPATITIS C ANTIBODY: Hep C Virus Ab: NONREACTIVE

## 2021-11-29 LAB — HIV ANTIBODY (ROUTINE TESTING W REFLEX): HIV Screen 4th Generation wRfx: NONREACTIVE

## 2022-02-26 ENCOUNTER — Ambulatory Visit (INDEPENDENT_AMBULATORY_CARE_PROVIDER_SITE_OTHER): Payer: BC Managed Care – PPO | Admitting: Nurse Practitioner

## 2022-02-26 ENCOUNTER — Encounter: Payer: Self-pay | Admitting: Nurse Practitioner

## 2022-02-26 VITALS — BP 138/80 | HR 81 | Temp 98.7°F | Ht 66.0 in | Wt 220.0 lb

## 2022-02-26 DIAGNOSIS — R490 Dysphonia: Secondary | ICD-10-CM | POA: Diagnosis not present

## 2022-02-26 DIAGNOSIS — H6690 Otitis media, unspecified, unspecified ear: Secondary | ICD-10-CM | POA: Diagnosis not present

## 2022-02-26 LAB — POCT RAPID STREP A (OFFICE): Rapid Strep A Screen: NEGATIVE

## 2022-02-26 LAB — POC INFLUENZA A&B (BINAX/QUICKVUE)
Influenza A, POC: NEGATIVE
Influenza B, POC: NEGATIVE

## 2022-02-26 MED ORDER — AMOXICILLIN 875 MG PO TABS: 875.0000 mg | ORAL_TABLET | Freq: Two times a day (BID) | ORAL | 0 refills | Status: DC

## 2022-02-26 NOTE — Patient Instructions (Signed)
  Earache, Adult An earache, or ear pain, can be caused by many things, including: An infection. Ear wax buildup. Ear pressure. Something in the ear that should not be there (foreign body). A sore throat. Tooth problems. Jaw problems. Treatment of the earache will depend on the cause. If the cause is not clear or cannot be known, you may need to watch your symptoms until your earache goes away or until a cause is found. Follow these instructions at home: Medicines Take or apply over-the-counter and prescription medicines only as told by your health care provider. If you were prescribed antibiotics, use them as told by your health care provider. Do not stop using the antibiotic even if you start to feel better. Do not put anything in your ear other than medicine that is prescribed by your health care provider. Managing pain     If directed, apply heat to the affected area as often as told by your health care provider. Use the heat source that your health care provider recommends, such as a moist heat pack or a heating pad. Place a towel between your skin and the heat source. Leave the heat on for 20-30 minutes. If your skin turns bright red, remove the heat right away to prevent burns. The risk of burns is higher if you cannot feel pain, heat, or cold. If directed, put ice on the affected area. To do this: Put ice in a plastic bag. Place a towel between your skin and the bag. Leave the ice on for 20 minutes, 2-3 times a day. If your skin turns bright red, remove the ice right away to prevent skin damage. The risk of skin damage is higher if you cannot feel pain, heat, or cold.  General instructions Pay attention to any changes in your symptoms. Try resting in an upright position instead of lying down. This may help to reduce pressure in your ear and relieve pain. Chew gum if it helps to relieve your ear pain. Treat any allergies as told by your health care provider. Drink enough  fluid to keep your urine pale yellow. It is up to you to get the results of any tests that were done. Ask your health care provider, or the department that is doing the tests, when your results will be ready. Contact a health care provider if: Your pain does not improve within 2 days. Your earache gets worse. You have new symptoms. You have a fever. Get help right away if: You have a severe headache. You have a stiff neck. You have trouble swallowing. You have redness or swelling behind your ear. You have fluid or blood coming from your ear. You have hearing loss. You feel dizzy. This information is not intended to replace advice given to you by your health care provider. Make sure you discuss any questions you have with your health care provider. Document Revised: 07/30/2021 Document Reviewed: 07/30/2021 Elsevier Patient Education  2023 Elsevier Inc.  

## 2022-02-26 NOTE — Progress Notes (Signed)
I,Tianna Badgett,acting as a Neurosurgeon for [employer], FNP.,have documented all relevant documentation on the behalf of Arnette Felts, FNP,as directed by  Arnette Felts, FNP while in the presence of Arnette Felts, FNP.  Subjective:     Patient ID: Crystal Santana , female    DOB: Apr 22, 1980 , 41 y.o.   MRN: [id number]   Chief Complaint  Patient presents with   Otalgia    HPI  Patient presents today for ear pain. Last night when trying to lay down to go to sleep she had ear pain. She will hear air in her ear. She has had a cough but this morning she woke up with her voice is "gone". Denies fever or chills. Denies muscle aches. Her son was sick last week. She has not had the influenza or most recent covid vaccine. She has taken tylenol and mucinex cold and flu last night.  Pt also have voice changes.      History reviewed. No pertinent past medical history.   Family History  Problem Relation Age of Onset   Scleroderma Mother      Current Outpatient Medications:    amoxicillin (AMOXIL) 875 MG tablet, Take 1 tablet (875 mg total) by mouth 2 (two) times daily., Disp: 14 tablet, Rfl: 0   Coenzyme Q10 (COQ-10) 150 MG CAPS, Take 150 mg by mouth 1 day or 1 dose., Disp: 30 each, Rfl: 0   ibuprofen (ADVIL,MOTRIN) 600 MG tablet, Take 1 tablet (600 mg total) by mouth every 6 (six) hours as needed., Disp: 30 tablet, Rfl: 0   Naltrexone-buPROPion HCl ER (CONTRAVE) 8-90 MG TB12, Take 2 tablets twice a day, Disp: 120 tablet, Rfl: 0   No Known Allergies   Review of Systems  Constitutional:  Negative for chills, fatigue and fever.  HENT:  Positive for ear pain and voice change.   Respiratory:  Positive for cough (mild dry cough).   Cardiovascular: Negative.   Musculoskeletal:  Negative for arthralgias and myalgias.  Psychiatric/Behavioral: Negative.       Today's Vitals   02/26/22 1033  BP: 138/80  Pulse: 81  Temp: 98.7 F (37.1 C)  TempSrc: Oral  Weight: 220 lb (99.8 kg)  Height: 5\' 6"   (1.676 m)  PainSc: 10-Worst pain ever   Body mass index is 35.51 kg/m.   Objective:  Physical Exam Vitals reviewed.  Constitutional:      General: She is not in acute distress.    Appearance: Normal appearance. She is obese.  HENT:     Right Ear: Hearing and external ear normal. There is no impacted cerumen. Tympanic membrane is erythematous.     Left Ear: Hearing, tympanic membrane, ear canal and external ear normal.  Pulmonary:     Effort: Pulmonary effort is normal. No respiratory distress.     Breath sounds: Normal breath sounds. No wheezing.  Neurological:     Mental Status: She is alert.         Assessment And Plan:     1. Acute otitis media, unspecified otitis media type Comments: Right ear is erythematous will treat with antibiotic. Rapid strep and Influenza A/B are negative. - amoxicillin (AMOXIL) 875 MG tablet; Take 1 tablet (875 mg total) by mouth 2 (two) times daily.  Dispense: 14 tablet; Refill: 0 - POC Influenza A&B (Binax test)  2. Hoarseness of voice Comments: Oropharynx is erythematous. Encouraged to use hot tea and lemon. - POCT rapid strep A - POC Influenza A&B (Binax test)    Patient was  given opportunity to ask questions. Patient verbalized understanding of the plan and was able to repeat key elements of the plan. All questions were answered to their satisfaction.  Arnette Felts, FNP   I, Arnette Felts, FNP, have reviewed all documentation for this visit. The documentation on 02/26/22 for the exam, diagnosis, procedures, and orders are all accurate and complete.   IF YOU HAVE BEEN REFERRED TO A SPECIALIST, IT MAY TAKE 1-2 WEEKS TO SCHEDULE/PROCESS THE REFERRAL. IF YOU HAVE NOT HEARD FROM US/SPECIALIST IN TWO WEEKS, PLEASE GIVE [place] A CALL AT [phone].   THE PATIENT IS ENCOURAGED TO PRACTICE SOCIAL DISTANCING DUE TO THE COVID-19 PANDEMIC.

## 2022-04-15 ENCOUNTER — Ambulatory Visit: Payer: BC Managed Care – PPO | Admitting: Nurse Practitioner

## 2022-04-15 ENCOUNTER — Encounter: Payer: Self-pay | Admitting: Nurse Practitioner

## 2022-04-15 VITALS — BP 128/70 | HR 97 | Temp 98.7°F | Ht 66.0 in | Wt 229.0 lb

## 2022-04-15 DIAGNOSIS — E6609 Other obesity due to excess calories: Secondary | ICD-10-CM

## 2022-04-15 DIAGNOSIS — Z6835 Body mass index (BMI) 35.0-35.9, adult: Secondary | ICD-10-CM

## 2022-04-15 DIAGNOSIS — E782 Mixed hyperlipidemia: Secondary | ICD-10-CM | POA: Diagnosis not present

## 2022-04-15 MED ORDER — CONTRAVE 8-90 MG PO TB12: ORAL_TABLET | ORAL | 1 refills | Status: DC

## 2022-04-15 NOTE — Progress Notes (Signed)
I,Tianna Badgett,acting as a Education administrator for [employer], FNP.,have documented all relevant documentation on the behalf of Minette Brine, FNP,as directed by  Minette Brine, FNP while in the presence of Minette Brine, FNP Subjective:     Patient ID: Crystal Santana , female    DOB: May 21, 1980 , 42 y.o.   MRN: [id number]   Chief Complaint  Patient presents with   Hyperlipidemia    HPI  Patient presents today for cholesterol follow up. She has not been doing well with her cholesterol, does not think there is any improvement. She is exercising more - walking 5 days a week 30 minutes a day  Wt Readings from Last 3 Encounters: 04/15/22 : 229 lb (103.9 kg) 02/26/22 : 220 lb (99.8 kg) 11/28/21 : 218 lb 3.2 oz (99 kg)  She was taking contrave and did well when consistent. She had been sick twice since that visit.     Hyperlipidemia This is a new problem. This is a new diagnosis. The problem is uncontrolled. She has no history of chronic renal disease. Factors aggravating her hyperlipidemia include fatty foods. Pertinent negatives include no chest pain. Compliance problems include adherence to diet and adherence to exercise.  Risk factors for coronary artery disease include obesity and a sedentary lifestyle.     History reviewed. No pertinent past medical history.   Family History  Problem Relation Age of Onset   Scleroderma Mother      Current Outpatient Medications:    BLISOVI FE 1/20 1-20 MG-MCG tablet, Take 1 tablet by mouth daily., Disp: , Rfl:    Naltrexone-buPROPion HCl ER (CONTRAVE) 8-90 MG TB12, Take 2 tablets twice a day, Disp: 120 tablet, Rfl: 1   No Known Allergies   Review of Systems  Constitutional: Negative.   Respiratory: Negative.    Cardiovascular: Negative.  Negative for chest pain.  Gastrointestinal: Negative.   Neurological: Negative.      Today's Vitals   04/15/22 1154  BP: 128/70  Pulse: 97  Temp: 98.7 F (37.1 C)  TempSrc: Oral  Weight: 229 lb (103.9  kg)  Height: 5\' 6"  (1.676 m)   Body mass index is 36.96 kg/m.   Objective:  Physical Exam Vitals reviewed.  Constitutional:      General: She is not in acute distress.    Appearance: Normal appearance.  Cardiovascular:     Rate and Rhythm: Normal rate and regular rhythm.     Pulses: Normal pulses.     Heart sounds: Normal heart sounds. No murmur heard. Pulmonary:     Effort: Pulmonary effort is normal. No respiratory distress.     Breath sounds: Normal breath sounds. No wheezing.  Neurological:     General: No focal deficit present.     Mental Status: She is alert and oriented to person, place, and time.     Cranial Nerves: No cranial nerve deficit.     Motor: No weakness.         Assessment And Plan:     1. Mixed hyperlipidemia Comments: Will check at next visit, she has not been eating healthy and has been sick with a cold. Encouraged low fat diet and increase fiber intake.  2. Class 2 obesity due to excess calories without serious comorbidity with body mass index (BMI) of 35.0 to 35.9 in adult Comments: Restart Contrave titration. Encouraged to now incorporate more strength training at least 2 times a week. Encouraged to eat healthy diet - Naltrexone-buPROPion HCl ER (CONTRAVE) 8-90 MG TB12; Take 2  tablets twice a day  Dispense: 120 tablet; Refill: 1     Patient was given opportunity to ask questions. Patient verbalized understanding of the plan and was able to repeat key elements of the plan. All questions were answered to their satisfaction.  Minette Brine, FNP   I, Minette Brine, FNP, have reviewed all documentation for this visit. The documentation on 04/15/22 for the exam, diagnosis, procedures, and orders are all accurate and complete.   IF YOU HAVE BEEN REFERRED TO A SPECIALIST, IT MAY TAKE 1-2 WEEKS TO SCHEDULE/PROCESS THE REFERRAL. IF YOU HAVE NOT HEARD FROM US/SPECIALIST IN TWO WEEKS, PLEASE GIVE [place] A CALL AT [phone].   THE PATIENT IS ENCOURAGED TO  PRACTICE SOCIAL DISTANCING DUE TO THE COVID-19 PANDEMIC.

## 2022-04-15 NOTE — Patient Instructions (Signed)
Dyslipidemia Dyslipidemia is an imbalance of waxy, fat-like substances (lipids) in the blood. The body needs lipids in small amounts. Dyslipidemia often involves a high level of cholesterol or triglycerides, which are types of lipids. Common forms of dyslipidemia include: High levels of LDL cholesterol. LDL is the type of cholesterol that causes fatty deposits (plaques) to build up in the blood vessels that carry blood away from the heart (arteries). Low levels of HDL cholesterol. HDL cholesterol is the type of cholesterol that protects against heart disease. High levels of HDL remove the LDL buildup from arteries. High levels of triglycerides. Triglycerides are a fatty substance in the blood that is linked to a buildup of plaques in the arteries. What are the causes? There are two main types of dyslipidemia: primary and secondary. Primary dyslipidemia is caused by changes (mutations) in genes that are passed down through families (inherited). These mutations cause several types of dyslipidemia. Secondary dyslipidemia may be caused by various risk factors that can lead to the disease, such as lifestyle choices and certain medical conditions. What increases the risk? You are more likely to develop this condition if you are an older man or if you are a woman who has gone through menopause. Other risk factors include: Having a family history of dyslipidemia. Taking certain medicines, including birth control pills, steroids, some diuretics, and beta-blockers. Eating a diet high in saturated fat. Smoking cigarettes or excessive alcohol intake. Having certain medical conditions such as diabetes, polycystic ovary syndrome (PCOS), kidney disease, liver disease, or hypothyroidism. Not exercising regularly. Being overweight or obese with too much belly fat. What are the signs or symptoms? In most cases, dyslipidemia does not usually cause any symptoms. In severe cases, very high lipid levels can  cause: Fatty bumps under the skin (xanthomas). A white or gray ring around the black center (pupil) of the eye. Very high triglyceride levels can cause inflammation of the pancreas (pancreatitis). How is this diagnosed? Your health care provider may diagnose dyslipidemia based on a routine blood test (fasting blood test). Because most people do not have symptoms of the condition, this blood testing (lipid profile) is done on adults age 20 and older and is repeated every 4-6 years. This test checks: Total cholesterol. This measures the total amount of cholesterol in your blood, including LDL cholesterol, HDL cholesterol, and triglycerides. A healthy number is below 200 mg/dL (5.17 mmol/L). LDL cholesterol. The target number for LDL cholesterol is different for each person, depending on individual risk factors. A healthy number is usually below 100 mg/dL (2.59 mmol/L). Ask your health care provider what your LDL cholesterol should be. HDL cholesterol. An HDL level of 60 mg/dL (1.55 mmol/L) or higher is best because it helps to protect against heart disease. A number below 40 mg/dL (1.03 mmol/L) for men or below 50 mg/dL (1.29 mmol/L) for women increases the risk for heart disease. Triglycerides. A healthy triglyceride number is below 150 mg/dL (1.69 mmol/L). If your lipid profile is abnormal, your health care provider may do other blood tests. How is this treated? Treatment depends on the type of dyslipidemia that you have and your other risk factors for heart disease and stroke. Your health care provider will have a target range for your lipid levels based on this information. Treatment for dyslipidemia starts with lifestyle changes, such as diet and exercise. Your health care provider may recommend that you: Get regular exercise. Make changes to your diet. Quit smoking if you smoke. Limit your alcohol intake. If diet   changes and exercise do not help you reach your goals, your health care provider  may also prescribe medicine to lower lipids. The most commonly prescribed type of medicine lowers your LDL cholesterol (statin drug). If you have a high triglyceride level, your provider may prescribe another type of drug (fibrate) or an omega-3 fish oil supplement, or both. Follow these instructions at home: Eating and drinking  Follow instructions from your health care provider or dietitian about eating or drinking restrictions. Eat a healthy diet as told by your health care provider. This can help you reach and maintain a healthy weight, lower your LDL cholesterol, and raise your HDL cholesterol. This may include: Limiting your calories, if you are overweight. Eating more fruits, vegetables, whole grains, fish, and lean meats. Limiting saturated fat, trans fat, and cholesterol. Do not drink alcohol if: Your health care provider tells you not to drink. You are pregnant, may be pregnant, or are planning to become pregnant. If you drink alcohol: Limit how much you have to: 0-1 drink a day for women. 0-2 drinks a day for men. Know how much alcohol is in your drink. In the [place] one drink equals one 12 oz bottle of beer (355 mL), one 5 oz glass of wine (148 mL), or one 1 oz glass of hard liquor (44 mL). Activity Get regular exercise. Start an exercise and strength training program as told by your health care provider. Ask your health care provider what activities are safe for you. Your health care provider may recommend: 30 minutes of aerobic activity 4-6 days a week. Brisk walking is an example of aerobic activity. Strength training 2 days a week. General instructions Do not use any products that contain nicotine or tobacco. These products include cigarettes, chewing tobacco, and vaping devices, such as e-cigarettes. If you need help quitting, ask your health care provider. Take over-the-counter and prescription medicines only as told by your health care provider. This includes  supplements. Keep all follow-up visits. This is important. Contact a health care provider if: You are having trouble sticking to your exercise or diet plan. You are struggling to quit smoking or to control your use of alcohol. Summary Dyslipidemia often involves a high level of cholesterol or triglycerides, which are types of lipids. Treatment depends on the type of dyslipidemia that you have and your other risk factors for heart disease and stroke. Treatment for dyslipidemia starts with lifestyle changes, such as diet and exercise. Your health care provider may prescribe medicine to lower lipids. This information is not intended to replace advice given to you by your health care provider. Make sure you discuss any questions you have with your health care provider. Document Revised: 10/19/2021 Document Reviewed: 05/22/2020 Elsevier Patient Education  2023 Elsevier Inc.  

## 2022-06-13 ENCOUNTER — Encounter: Payer: Self-pay | Admitting: Nurse Practitioner

## 2022-06-20 ENCOUNTER — Other Ambulatory Visit: Payer: Self-pay | Admitting: Nurse Practitioner

## 2022-06-20 ENCOUNTER — Encounter: Payer: Self-pay | Admitting: Nurse Practitioner

## 2022-06-20 ENCOUNTER — Ambulatory Visit: Payer: BC Managed Care – PPO | Admitting: Nurse Practitioner

## 2022-06-20 VITALS — BP 122/76 | HR 90 | Temp 98.8°F | Ht 66.0 in | Wt 236.4 lb

## 2022-06-20 DIAGNOSIS — E6609 Other obesity due to excess calories: Secondary | ICD-10-CM

## 2022-06-20 DIAGNOSIS — Z6838 Body mass index (BMI) 38.0-38.9, adult: Secondary | ICD-10-CM

## 2022-06-20 MED ORDER — WEGOVY 0.25 MG/0.5ML ~~LOC~~ SOAJ: 0.2500 mg | SUBCUTANEOUS | 0 refills | Status: DC

## 2022-06-20 NOTE — Progress Notes (Signed)
I,Sheena H Holbrook,acting as a Education administrator for Minette Brine, FNP.,have documented all relevant documentation on the behalf of Minette Brine, FNP,as directed by  Minette Brine, FNP while in the presence of Minette Brine, [place].    Subjective:     Patient ID: Crystal Santana , female    DOB: October 06, 1980 , 43 y.o.   MRN: [id number]   Chief Complaint  Patient presents with   Weight Check    HPI  Patient presents today for weight check. Contrave was up to 3 tabs per day. She is exercising with more walking and hand weights.   Wt Readings from Last 3 Encounters: 06/20/22 : 236 lb 6.4 oz (107.2 kg) 04/15/22 : 229 lb (103.9 kg) 02/26/22 : 220 lb (99.8 kg)  She has started birth control, she began craving sweets. She does feel like she has improved since stopping the birth control. She drinks 3-4 bottles of water a day.        History reviewed. No pertinent past medical history.   Family History  Problem Relation Age of Onset   Scleroderma Mother      Current Outpatient Medications:    BLISOVI FE 1/20 1-20 MG-MCG tablet, Take 1 tablet by mouth daily., Disp: , Rfl:    phentermine 15 MG capsule, Take 1 capsule (15 mg total) by mouth every morning., Disp: 30 capsule, Rfl: 1   No Known Allergies   Review of Systems  Constitutional: Negative.   Respiratory: Negative.    Cardiovascular: Negative.  Negative for chest pain.  Gastrointestinal: Negative.   Neurological: Negative.      Today's Vitals   06/20/22 1209  BP: 122/76  Pulse: 90  Temp: 98.8 F (37.1 C)  TempSrc: Oral  SpO2: 97%  Weight: 236 lb 6.4 oz (107.2 kg)  Height: 5\' 6"  (1.676 m)   Body mass index is 38.16 kg/m.   Objective:  Physical Exam Vitals reviewed.  Constitutional:      General: She is not in acute distress.    Appearance: Normal appearance.  Cardiovascular:     Rate and Rhythm: Normal rate and regular rhythm.     Pulses: Normal pulses.     Heart sounds: Normal heart sounds. No murmur  heard. Pulmonary:     Effort: Pulmonary effort is normal. No respiratory distress.     Breath sounds: Normal breath sounds. No wheezing.  Neurological:     General: No focal deficit present.     Mental Status: She is alert and oriented to person, place, and time.     Cranial Nerves: No cranial nerve deficit.     Motor: No weakness.         Assessment And Plan:     1. Class 2 obesity due to excess calories without serious comorbidity with body mass index (BMI) of 38.0 to 38.9 in adult Comments: She has had weight gain with contrave will try to get her approved for [facility], if not then will treat with short course of phentermine    Patient was given opportunity to ask questions. Patient verbalized understanding of the plan and was able to repeat key elements of the plan. All questions were answered to their satisfaction.  Minette Brine, FNP   I, Minette Brine, FNP, have reviewed all documentation for this visit. The documentation on 06/20/22 for the exam, diagnosis, procedures, and orders are all accurate and complete.   IF YOU HAVE BEEN REFERRED TO A SPECIALIST, IT MAY TAKE 1-2 WEEKS TO SCHEDULE/PROCESS THE REFERRAL. IF YOU  HAVE NOT HEARD FROM US/SPECIALIST IN TWO WEEKS, PLEASE GIVE [place] A CALL AT [phone].   THE PATIENT IS ENCOURAGED TO PRACTICE SOCIAL DISTANCING DUE TO THE COVID-19 PANDEMIC.

## 2022-06-25 ENCOUNTER — Encounter: Payer: Self-pay | Admitting: Nurse Practitioner

## 2022-07-02 ENCOUNTER — Other Ambulatory Visit: Payer: Self-pay | Admitting: Nurse Practitioner

## 2022-07-02 DIAGNOSIS — E6609 Other obesity due to excess calories: Secondary | ICD-10-CM

## 2022-07-02 MED ORDER — PHENTERMINE HCL 15 MG PO CAPS: 15.0000 mg | ORAL_CAPSULE | ORAL | 1 refills | Status: DC

## 2022-12-03 ENCOUNTER — Ambulatory Visit (INDEPENDENT_AMBULATORY_CARE_PROVIDER_SITE_OTHER): Payer: BC Managed Care – PPO | Admitting: Nurse Practitioner

## 2022-12-03 ENCOUNTER — Encounter: Payer: Self-pay | Admitting: Nurse Practitioner

## 2022-12-03 VITALS — BP 110/70 | HR 75 | Temp 98.4°F | Ht 66.0 in | Wt 235.0 lb

## 2022-12-03 DIAGNOSIS — Z6837 Body mass index (BMI) 37.0-37.9, adult: Secondary | ICD-10-CM

## 2022-12-03 DIAGNOSIS — E782 Mixed hyperlipidemia: Secondary | ICD-10-CM | POA: Diagnosis not present

## 2022-12-03 DIAGNOSIS — Z0000 Encounter for general adult medical examination without abnormal findings: Secondary | ICD-10-CM | POA: Diagnosis not present

## 2022-12-03 DIAGNOSIS — E6609 Other obesity due to excess calories: Secondary | ICD-10-CM | POA: Diagnosis not present

## 2022-12-03 DIAGNOSIS — Z79899 Other long term (current) drug therapy: Secondary | ICD-10-CM

## 2022-12-03 DIAGNOSIS — Z2821 Immunization not carried out because of patient refusal: Secondary | ICD-10-CM

## 2022-12-03 NOTE — Assessment & Plan Note (Addendum)
Cholesterol levels were elevated at last visit. Will recheck lipid panel today. Continue focusing on low fat diet

## 2022-12-03 NOTE — Patient Instructions (Addendum)
You can take berbine supplement this may help with glucose control. It may help with your sugar cravings.  Goal to exercise 150 minutes per week with at least 2 days of strength training Encouraged to park further when at the store, take stairs instead of elevators and to walk in place during commercials. Increase water intake to at least one gallon of water daily. For your weight loss journey focus on lifestyle changes.

## 2022-12-03 NOTE — Progress Notes (Signed)
Madelaine Bhat, CMA,acting as a Neurosurgeon for Arnette Felts, FNP.,have documented all relevant documentation on the behalf of Arnette Felts, FNP,as directed by  Arnette Felts, FNP while in the presence of Arnette Felts, FNP.  Subjective:    Patient ID: Crystal Santana , female    DOB: [birth year] , 42 y.o.   MRN: [id number]  Chief Complaint  Patient presents with   Annual Exam    HPI  Patient presents today for HM, patient reports compliance with medications. Patient denies any chest pain, SOB, or headaches. Patient has no concerns today. Followed by GYN - Dr. Harriette Bouillon in [place] next appt in November  BP Readings from Last 3 Encounters: 12/03/22 : 110/70 06/20/22 : 122/76 04/15/22 : 128/70       History reviewed. No pertinent past medical history.   Family History  Problem Relation Age of Onset   Scleroderma Mother     No current outpatient medications on file.   No Known Allergies    The patient states she uses none for birth control. Patient's last menstrual period was 11/10/2022.. Negative for Dysmenorrhea and Negative for Menorrhagia. Negative for: breast discharge, breast lump(s), breast pain and breast self exam. Associated symptoms include abnormal vaginal bleeding. Pertinent negatives include abnormal bleeding (hematology), anxiety, decreased libido, depression, difficulty falling sleep, dyspareunia, history of infertility, nocturia, sexual dysfunction, sleep disturbances, urinary incontinence, urinary urgency, vaginal discharge and vaginal itching. Diet regular; she is trying to eat better and trying to avoid craving sugars. The patient states her exercise level is minimal with walking 3 times a week for 20 minutes.   The patient's tobacco use is:  Social History   Tobacco Use  Smoking Status Never  Smokeless Tobacco Never   She has been exposed to passive smoke. The patient's alcohol use is:  Social History   Substance and Sexual Activity  Alcohol Use Yes    Comment: socially   Additional information: Last pap 11/20/2020, next one scheduled for 11/21/2023.    Review of Systems  Constitutional: Negative.   HENT: Negative.    Eyes: Negative.   Respiratory: Negative.    Cardiovascular: Negative.   Gastrointestinal: Negative.   Endocrine: Negative.   Genitourinary: Negative.   Musculoskeletal: Negative.   Skin: Negative.   Allergic/Immunologic: Negative.   Neurological: Negative.   Hematological: Negative.   Psychiatric/Behavioral: Negative.       Today's Vitals   12/03/22 1440  BP: 110/70  Pulse: 75  Temp: 98.4 F (36.9 C)  TempSrc: Oral  Weight: 235 lb (106.6 kg)  Height: 5\' 6"  (1.676 m)  PainSc: 0-No pain   Body mass index is 37.93 kg/m.  Wt Readings from Last 3 Encounters:  12/03/22 235 lb (106.6 kg)  06/20/22 236 lb 6.4 oz (107.2 kg)  04/15/22 229 lb (103.9 kg)     Objective:  Physical Exam Vitals reviewed.  Constitutional:      General: She is not in acute distress.    Appearance: Normal appearance. She is well-developed. She is obese.  HENT:     Head: Normocephalic and atraumatic.     Right Ear: Hearing, tympanic membrane, ear canal and external ear normal. There is no impacted cerumen.     Left Ear: Hearing, tympanic membrane, ear canal and external ear normal. There is no impacted cerumen.     Nose:     Comments: Deferred - masked    Mouth/Throat:     Comments: Deferred - masked Eyes:     General: Lids are  normal.     Extraocular Movements: Extraocular movements intact.     Conjunctiva/sclera: Conjunctivae normal.     Pupils: Pupils are equal, round, and reactive to light.     Funduscopic exam:    Right eye: No papilledema.        Left eye: No papilledema.  Neck:     Thyroid: No thyroid mass.     Vascular: No carotid bruit.  Cardiovascular:     Rate and Rhythm: Normal rate and regular rhythm.     Pulses: Normal pulses.     Heart sounds: Normal heart sounds. No murmur heard. Pulmonary:     Effort:  Pulmonary effort is normal. No respiratory distress.     Breath sounds: Normal breath sounds. No wheezing.  Chest:     Chest wall: No mass.  Breasts:    Tanner Score is 5.     Right: Normal. No mass or tenderness.     Left: Normal. No mass or tenderness.  Abdominal:     General: Abdomen is flat. Bowel sounds are normal. There is no distension.     Palpations: Abdomen is soft.     Tenderness: There is no abdominal tenderness.  Genitourinary:    Rectum: Guaiac result negative.  Musculoskeletal:        General: No swelling. Normal range of motion.     Cervical back: Full passive range of motion without pain, normal range of motion and neck supple.     Right lower leg: No edema.     Left lower leg: No edema.  Lymphadenopathy:     Upper Body:     Right upper body: No supraclavicular, axillary or pectoral adenopathy.     Left upper body: No supraclavicular, axillary or pectoral adenopathy.  Skin:    General: Skin is warm and dry.     Capillary Refill: Capillary refill takes less than 2 seconds.  Neurological:     General: No focal deficit present.     Mental Status: She is alert and oriented to person, place, and time.     Cranial Nerves: No cranial nerve deficit.     Sensory: No sensory deficit.     Motor: No weakness.  Psychiatric:        Mood and Affect: Mood normal.        Behavior: Behavior normal.        Thought Content: Thought content normal.        Judgment: Judgment normal.         Assessment And Plan:     Encounter for annual health examination Assessment & Plan: Behavior modifications discussed and diet history reviewed.   Pt will continue to exercise regularly and modify diet with low GI, plant based foods and decrease intake of processed foods.  Recommend intake of daily multivitamin, Vitamin D, and calcium.  Recommend mammogram (she will get when she has her appt for GYN) for preventive screenings, as well as recommend immunizations that include influenza  (declined) and TDAP is up to date    Class 2 obesity due to excess calories without serious comorbidity with body mass index (BMI) of 37.0 to 37.9 in adult Assessment & Plan: She is encouraged to strive for BMI less than 30 to decrease cardiac risk. Continue exercising at least 150 minutes per week. Unfortunately her insurance does not cover GLP1 for weight loss. She was unable to tolerate phentermine. Has also been on contrave without significant benefit.    Orders: -  Hemoglobin A1c  Mixed hyperlipidemia Assessment & Plan: Cholesterol levels were elevated at last visit. Will recheck lipid panel today. Continue focusing on low fat diet  Orders: -     CMP14+EGFR -     Lipid panel  Influenza vaccination declined Assessment & Plan: Patient declined influenza vaccination at this time. Patient is aware that influenza vaccine prevents illness in 70% of healthy people, and reduces hospitalizations to 30-70% in elderly. This vaccine is recommended annually. Education has been provided regarding the importance of this vaccine but patient still declined. Advised may receive this vaccine at local pharmacy or Health Dept.or vaccine clinic. Aware to provide a copy of the vaccination record if obtained from local pharmacy or Health Dept.  Pt is willing to accept risk associated with refusing vaccination.     Other long term (current) drug therapy -     CBC with Differential/Platelet     Return for 1 year physical, 6 month chol check. Patient was given opportunity to ask questions. Patient verbalized understanding of the plan and was able to repeat key elements of the plan. All questions were answered to their satisfaction.   Arnette Felts, FNP  I, Arnette Felts, FNP, have reviewed all documentation for this visit. The documentation on 12/03/22 for the exam, diagnosis, procedures, and orders are all accurate and complete.

## 2022-12-04 LAB — LIPID PANEL
Chol/HDL Ratio: 3.4 ratio (ref 0.0–4.4)
Cholesterol, Total: 202 mg/dL — ABNORMAL HIGH (ref 100–199)
HDL: 60 mg/dL (ref >39)
LDL Chol Calc (NIH): 127 mg/dL — ABNORMAL HIGH (ref 0–99)
Triglycerides: 82 mg/dL (ref 0–149)
VLDL Cholesterol Cal: 15 mg/dL (ref 5–40)

## 2022-12-04 LAB — CBC WITH DIFFERENTIAL/PLATELET
Basophils Absolute: 0.1 10*3/uL (ref 0.0–0.2)
Basos: 1 %
EOS (ABSOLUTE): 0.4 10*3/uL (ref 0.0–0.4)
Eos: 5 %
Hematocrit: 40.6 % (ref 34.0–46.6)
Hemoglobin: 12.6 g/dL (ref 11.1–15.9)
Immature Grans (Abs): 0 10*3/uL (ref 0.0–0.1)
Immature Granulocytes: 0 %
Lymphocytes Absolute: 2.9 10*3/uL (ref 0.7–3.1)
Lymphs: 39 %
MCH: 29.6 pg (ref 26.6–33.0)
MCHC: 31 g/dL — ABNORMAL LOW (ref 31.5–35.7)
MCV: 95 fL (ref 79–97)
Monocytes Absolute: 0.5 10*3/uL (ref 0.1–0.9)
Monocytes: 7 %
Neutrophils Absolute: 3.7 10*3/uL (ref 1.4–7.0)
Neutrophils: 48 %
Platelets: 385 10*3/uL (ref 150–450)
RBC: 4.26 x10E6/uL (ref 3.77–5.28)
RDW: 12.5 % (ref 11.7–15.4)
WBC: 7.5 10*3/uL (ref 3.4–10.8)

## 2022-12-04 LAB — CMP14+EGFR
ALT: 16 IU/L (ref 0–32)
AST: 12 IU/L (ref 0–40)
Albumin: 4 g/dL (ref 3.9–4.9)
Alkaline Phosphatase: 52 IU/L (ref 44–121)
BUN/Creatinine Ratio: 14 (ref 9–23)
BUN: 10 mg/dL (ref 6–24)
Bilirubin Total: 0.5 mg/dL (ref 0.0–1.2)
CO2: 23 mmol/L (ref 20–29)
Calcium: 9.2 mg/dL (ref 8.7–10.2)
Chloride: 103 mmol/L (ref 96–106)
Creatinine, Ser: 0.73 mg/dL (ref 0.57–1.00)
Globulin, Total: 3.3 g/dL (ref 1.5–4.5)
Glucose: 85 mg/dL (ref 70–99)
Potassium: 4.6 mmol/L (ref 3.5–5.2)
Sodium: 138 mmol/L (ref 134–144)
Total Protein: 7.3 g/dL (ref 6.0–8.5)
eGFR: 106 mL/min/{1.73_m2} (ref >59)

## 2022-12-04 LAB — HEMOGLOBIN A1C
Est. average glucose Bld gHb Est-mCnc: 108 mg/dL
Hgb A1c MFr Bld: 5.4 % (ref 4.8–5.6)

## 2022-12-19 NOTE — Assessment & Plan Note (Signed)
She is encouraged to strive for BMI less than 30 to decrease cardiac risk. Continue exercising at least 150 minutes per week. Unfortunately her insurance does not cover GLP1 for weight loss. She was unable to tolerate phentermine. Has also been on contrave without significant benefit.

## 2022-12-19 NOTE — Assessment & Plan Note (Signed)

## 2022-12-19 NOTE — Assessment & Plan Note (Addendum)
Behavior modifications discussed and diet history reviewed.   Pt will continue to exercise regularly and modify diet with low GI, plant based foods and decrease intake of processed foods.  Recommend intake of daily multivitamin, Vitamin D, and calcium.  Recommend mammogram (she will get when she has her appt for GYN) for preventive screenings, as well as recommend immunizations that include influenza (declined) and TDAP is up to date

## 2023-06-02 ENCOUNTER — Ambulatory Visit: Payer: BC Managed Care – PPO | Admitting: Nurse Practitioner

## 2023-12-04 ENCOUNTER — Encounter: Payer: Self-pay | Admitting: Nurse Practitioner
